# Patient Record
Sex: Male | Born: 1935 | Race: White | Hispanic: No | Marital: Married | Smoking: Never smoker
Health system: Southern US, Community
[De-identification: ages and names within clinical notes are randomized; demographics above are authoritative.]

## PROBLEM LIST (undated history)

## (undated) DIAGNOSIS — I10 Essential (primary) hypertension: Secondary | ICD-10-CM

## (undated) DIAGNOSIS — E785 Hyperlipidemia, unspecified: Secondary | ICD-10-CM

## (undated) DIAGNOSIS — B019 Varicella without complication: Secondary | ICD-10-CM

## (undated) DIAGNOSIS — I714 Abdominal aortic aneurysm, without rupture, unspecified: Secondary | ICD-10-CM

## (undated) DIAGNOSIS — B029 Zoster without complications: Secondary | ICD-10-CM

## (undated) DIAGNOSIS — I614 Nontraumatic intracerebral hemorrhage in cerebellum: Secondary | ICD-10-CM

## (undated) DIAGNOSIS — K219 Gastro-esophageal reflux disease without esophagitis: Secondary | ICD-10-CM

## (undated) DIAGNOSIS — I639 Cerebral infarction, unspecified: Secondary | ICD-10-CM

## (undated) DIAGNOSIS — N400 Enlarged prostate without lower urinary tract symptoms: Secondary | ICD-10-CM

## (undated) DIAGNOSIS — R319 Hematuria, unspecified: Secondary | ICD-10-CM

## (undated) DIAGNOSIS — I2510 Atherosclerotic heart disease of native coronary artery without angina pectoris: Secondary | ICD-10-CM

## (undated) HISTORY — DX: Hyperlipidemia, unspecified: E78.5

## (undated) HISTORY — DX: Nontraumatic intracerebral hemorrhage in cerebellum: I61.4

## (undated) HISTORY — DX: Abdominal aortic aneurysm, without rupture: I71.4

## (undated) HISTORY — DX: Cerebral infarction, unspecified: I63.9

## (undated) HISTORY — DX: Atherosclerotic heart disease of native coronary artery without angina pectoris: I25.10

## (undated) HISTORY — DX: Gastro-esophageal reflux disease without esophagitis: K21.9

## (undated) HISTORY — DX: Varicella without complication: B01.9

## (undated) HISTORY — PX: HERNIA REPAIR: SHX51

## (undated) HISTORY — PX: SKIN GRAFT: SHX250

## (undated) HISTORY — DX: Abdominal aortic aneurysm, without rupture, unspecified: I71.40

## (undated) HISTORY — DX: Hematuria, unspecified: R31.9

## (undated) HISTORY — DX: Zoster without complications: B02.9

## (undated) HISTORY — DX: Benign prostatic hyperplasia without lower urinary tract symptoms: N40.0

## (undated) HISTORY — DX: Essential (primary) hypertension: I10

## (undated) HISTORY — PX: KNEE SURGERY: SHX244

## (undated) HISTORY — PX: CORONARY ARTERY BYPASS GRAFT: SHX141

---

## 2005-07-03 ENCOUNTER — Inpatient Hospital Stay (HOSPITAL_COMMUNITY): Admission: RE | Admit: 2005-07-03 | Discharge: 2005-07-07 | Payer: Self-pay | Admitting: Orthopedic Surgery

## 2010-07-29 DIAGNOSIS — I614 Nontraumatic intracerebral hemorrhage in cerebellum: Secondary | ICD-10-CM

## 2010-07-29 HISTORY — DX: Nontraumatic intracerebral hemorrhage in cerebellum: I61.4

## 2010-08-24 ENCOUNTER — Emergency Department: Payer: Self-pay | Admitting: Emergency Medicine

## 2011-09-22 IMAGING — CT CT HEAD WITHOUT CONTRAST
3 series · 17 of 30 positions shown, 19 images · non-contrast
Comparison: none

REASON FOR EXAM: headache  fall  syncope
COMMENTS:

PROCEDURE:     CT  - CT HEAD WITHOUT CONTRAST  - August 24, 2010  [DATE]
RESULT:     Head CT dated 08/24/2009.
TECHNIQUE: Helical noncontrasted 5 mm sections were obtained from the skull
base to the vertex.

[Series 2: without · axial · non-contrast · 0.45mm/px · z∈[+311,+446]mm · 8 of 35 slices shown]
[im 4/35  brain]
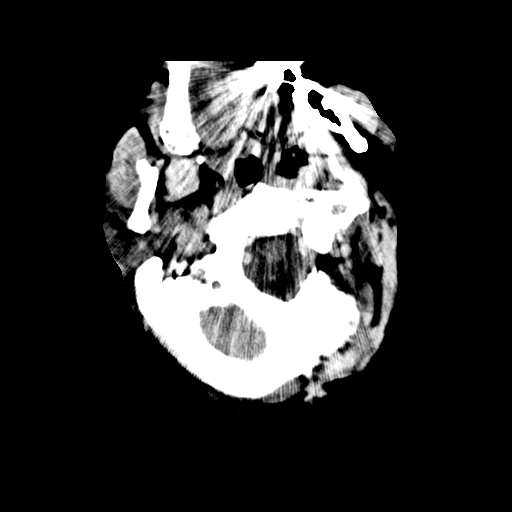
[im 7/35  brain]
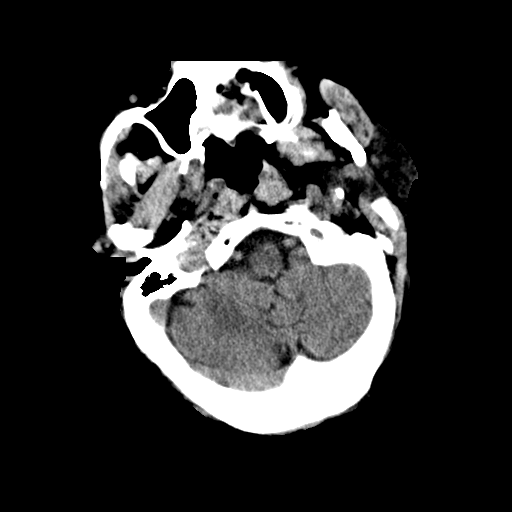
[im 11/35  brain]
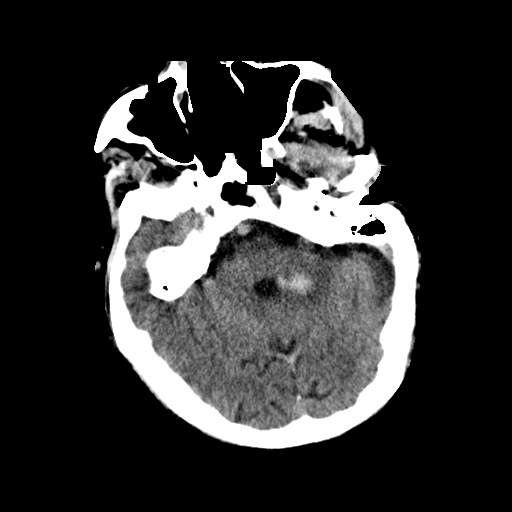
[im 14/35  brain]
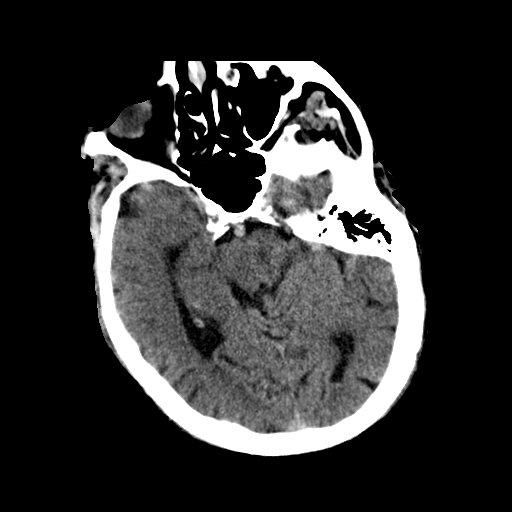
[im 21/35  brain]
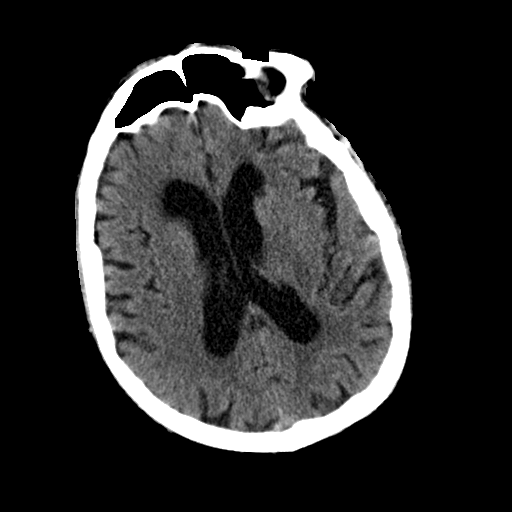
[im 24/35  brain]
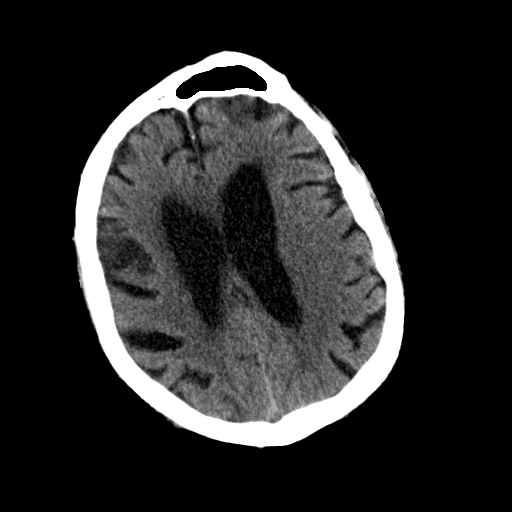
[im 28/35  brain]
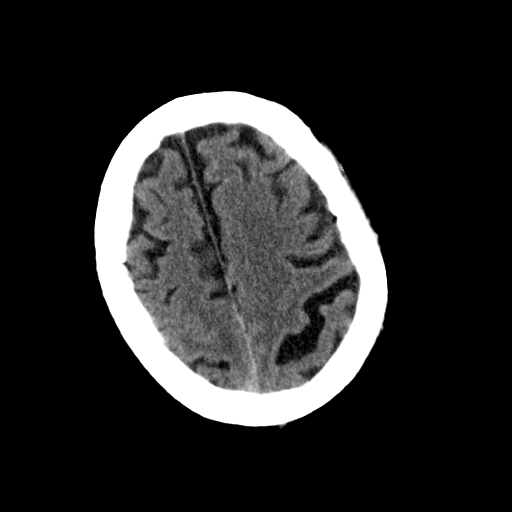
[im 31/35  brain]
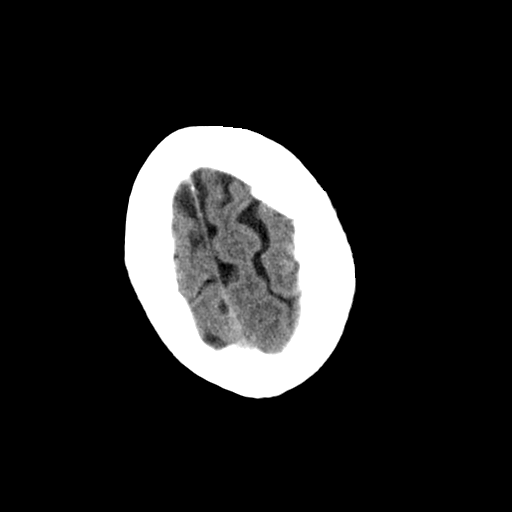

[Series 3: bone · axial · 0.45mm/px · z∈[+311,+331]mm · 2 of 35 slices shown]
[im 4/35  bone]
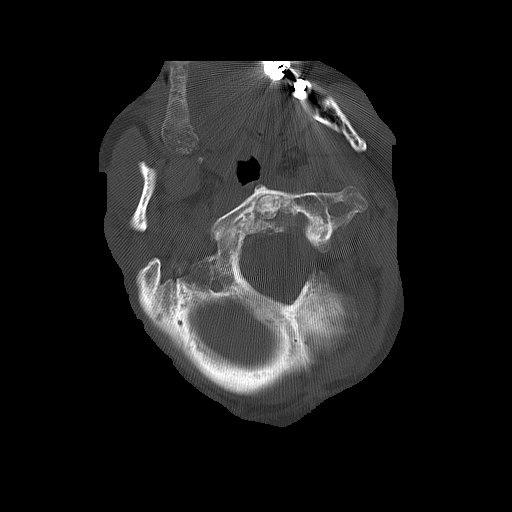
[im 8/35  bone]
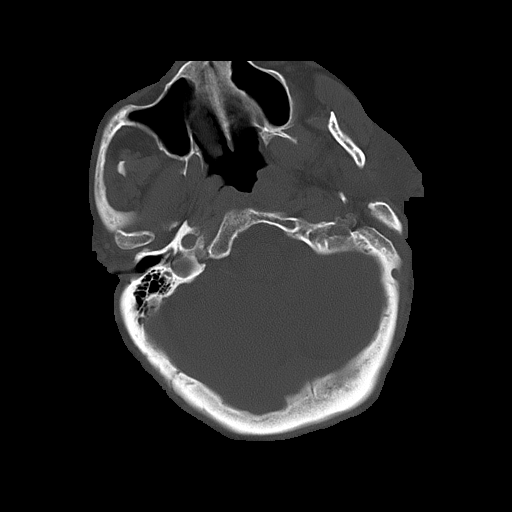

[Series 4: without recons · axial · non-contrast · 0.45mm/px · z∈[+359,+464]mm · 7 of 31 slices shown, 9 images]
[im 4/31  brain]
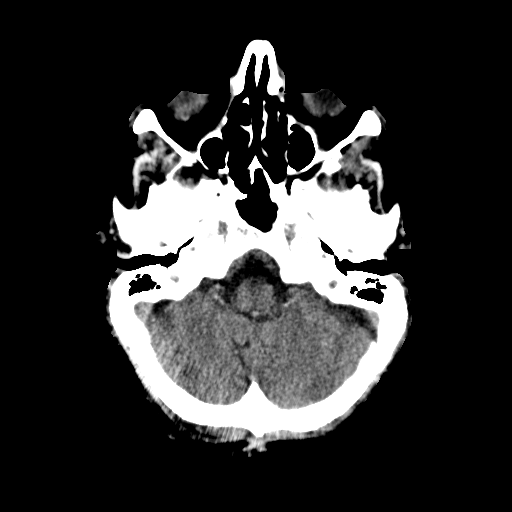
[im 4/31  bone]
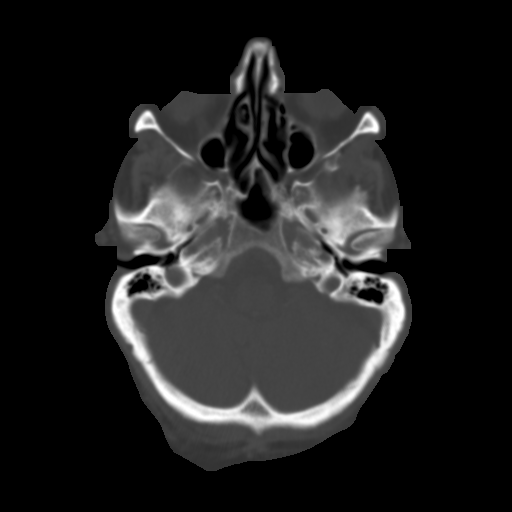
[im 8/31  brain]
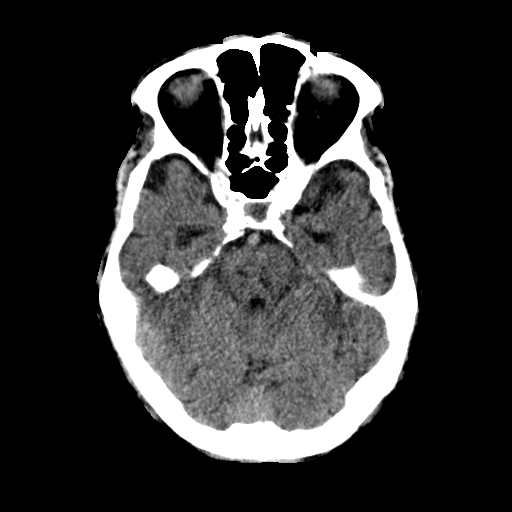
[im 12/31  brain]
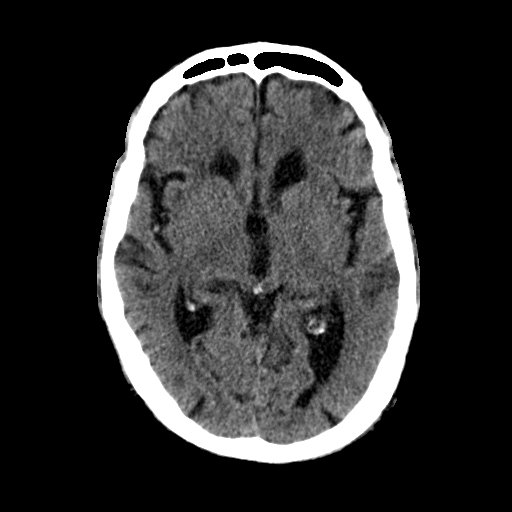
[im 16/31  brain]
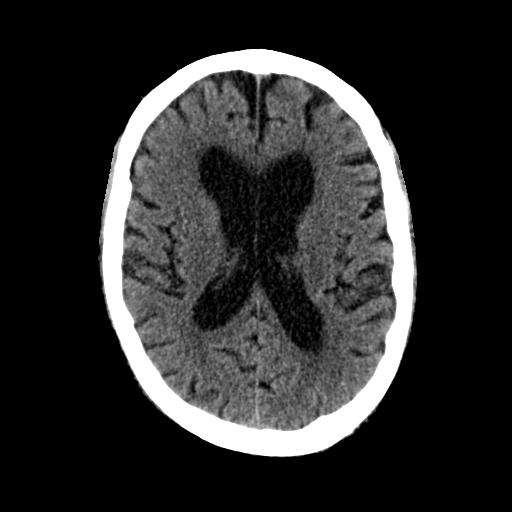
[im 19/31  brain]
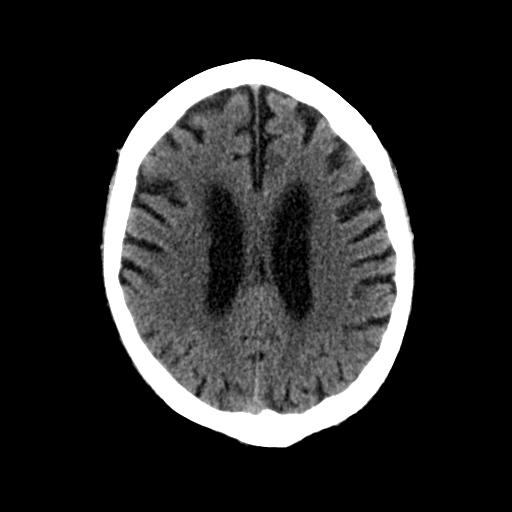
[im 19/31  bone]
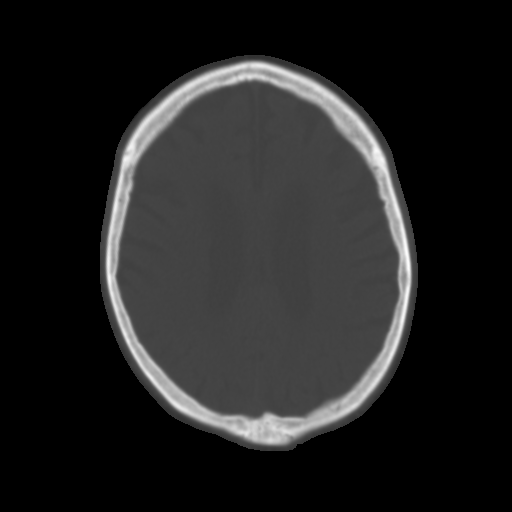
[im 23/31  brain]
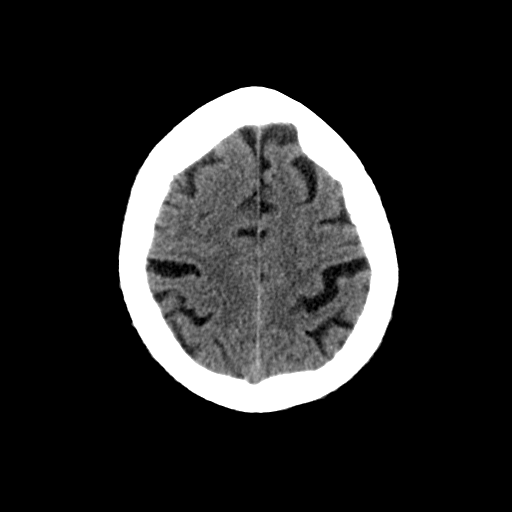
[im 27/31  brain]
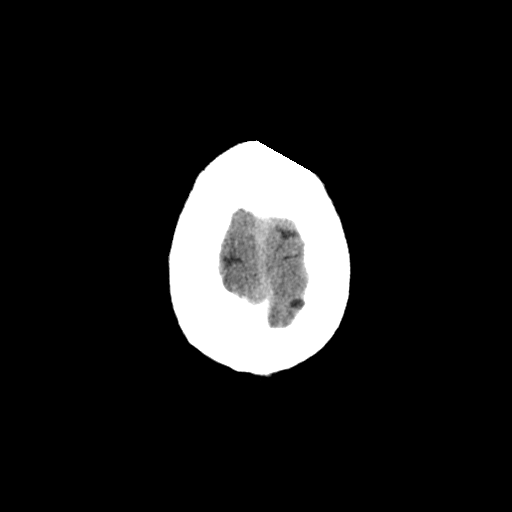

[17 of 30 positions shown; findings below may reference images not displayed]

FINDINGS: A 1.9 x 1.1 cm focus of high attenuation projects along the
cerebellar vermis region centrally on the left in a periventricular
location. This finding is consistent with an area of intraparenchymal
hemorrhage. There is no evidence of associated mass effect. There is diffuse
cortical atrophy an areas of low attenuation within the subcortical, deep,
and periventricular white matter regions. Hydrocephalus ex vacuo is
identified. The ventricles and cisterns are patent. There is no evidence of
a depressed skull fracture.
IMPRESSION: Intraparenchymal hemorrhage involving the left cerebellar
hemisphere.
2. Dr. Rendon of the emergency department was informed of these findings at
the time of initial interpretation.(*)

## 2011-09-22 IMAGING — CT CT CERVICAL SPINE WITHOUT CONTRAST
1 series · 12 of 14 positions shown, 15 images · non-contrast
Comparison: none

REASON FOR EXAM: fall
COMMENTS:

PROCEDURE:     CT  - CT CERVICAL SPINE WO  - August 24, 2010  [DATE]
RESULT:     CT cervical spine dated 08/24/2010.
TECHNIQUE: Multiplanar imaging of the cervical spine was obtained utilizing
helical 2 mm noncontrasted acquisition and bone reconstruction algorithm.

[Series 6: axial · axial · 0.34mm/px · z∈[+198,+352]mm · 12 of 91 slices shown, 15 images]
[im 7/91  soft-tissue]
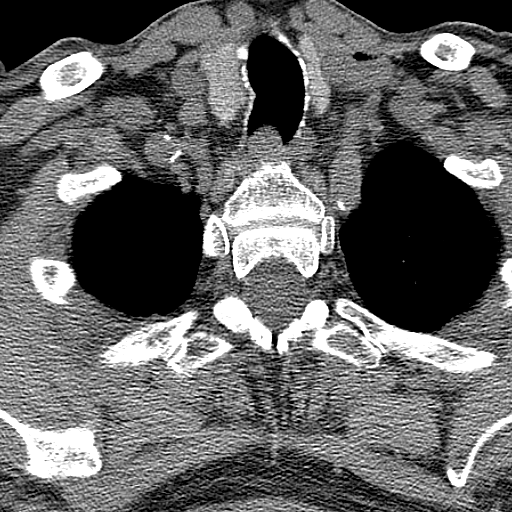
[im 7/91  bone]
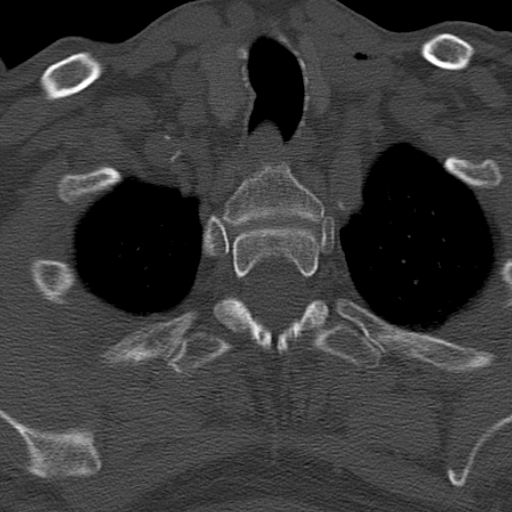
[im 14/91  bone]
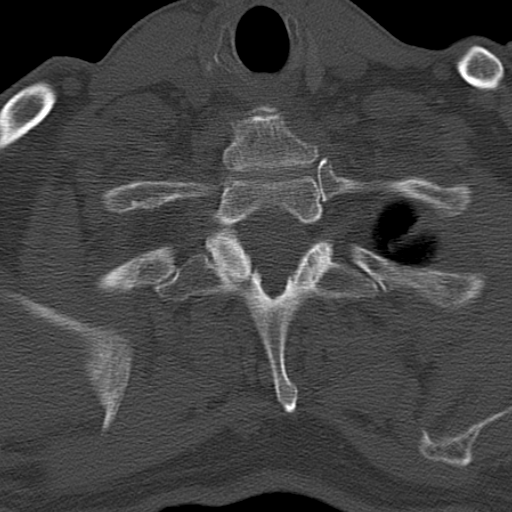
[im 21/91  bone]
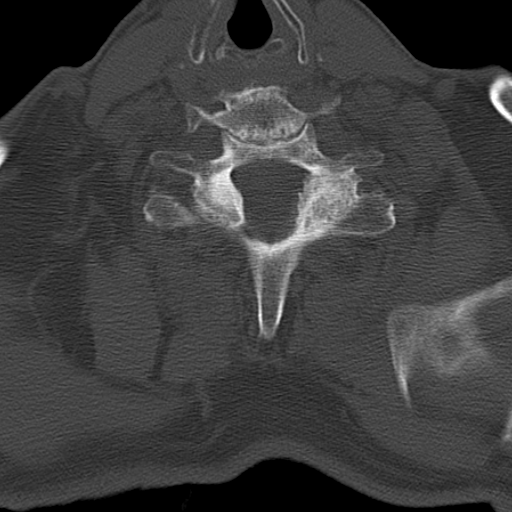
[im 28/91  bone]
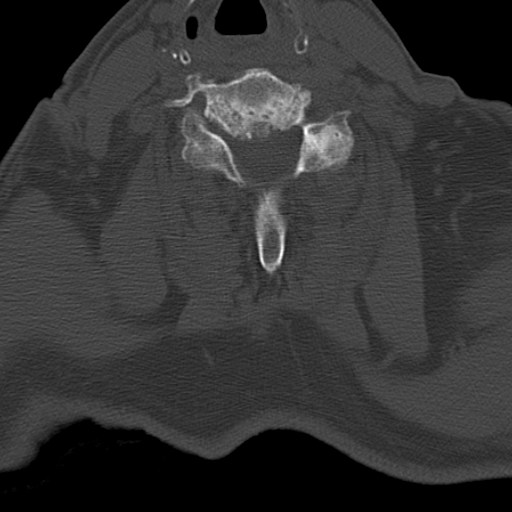
[im 35/91  soft-tissue]
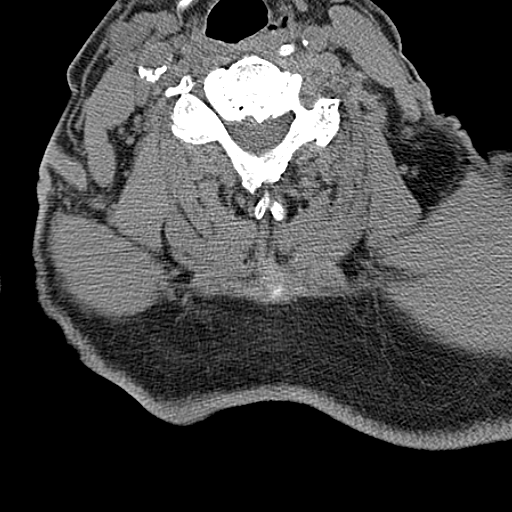
[im 35/91  bone]
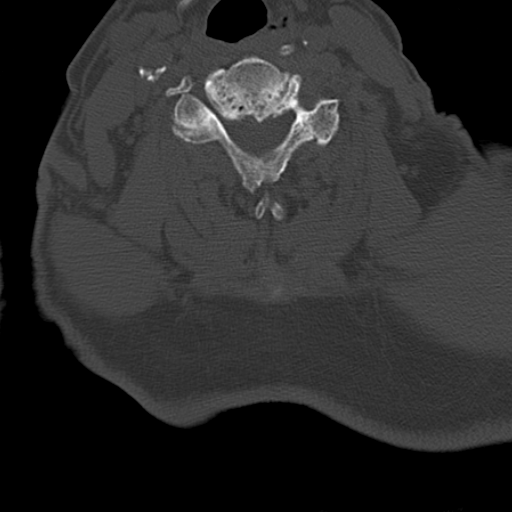
[im 42/91  bone]
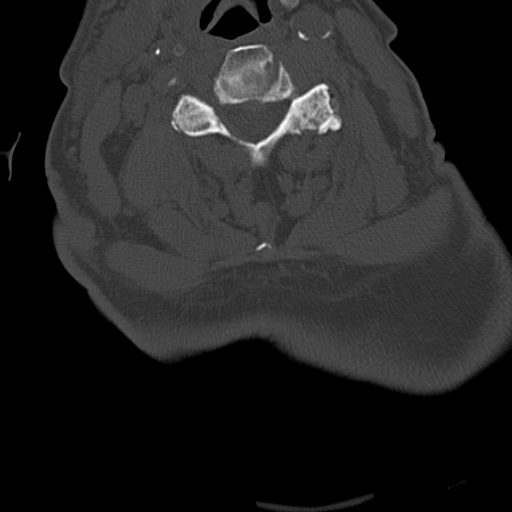
[im 49/91  bone]
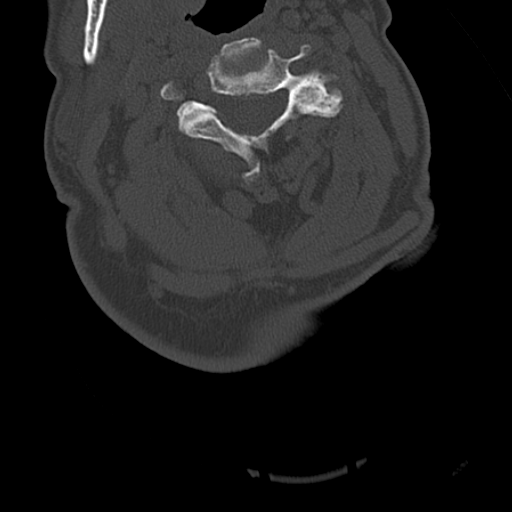
[im 56/91  bone]
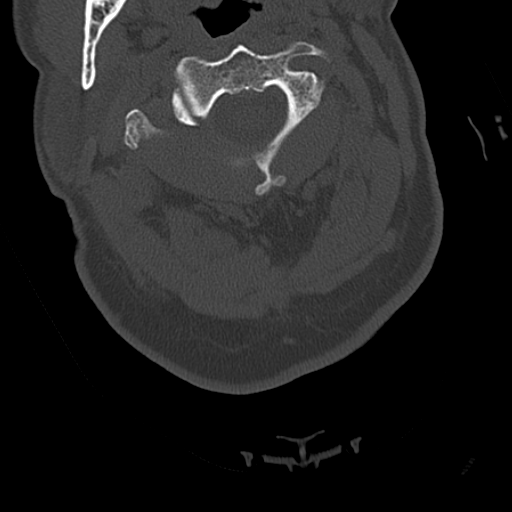
[im 63/91  soft-tissue]
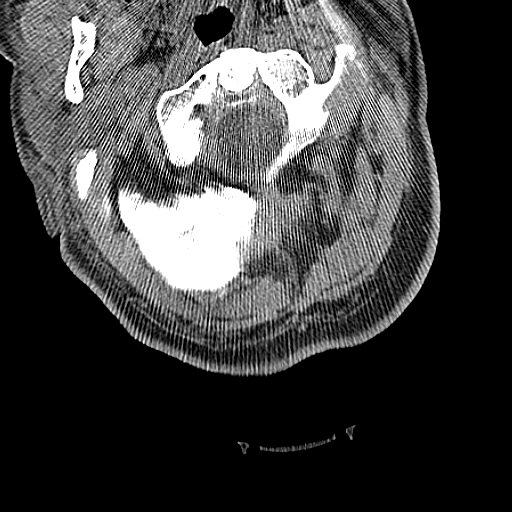
[im 63/91  bone]
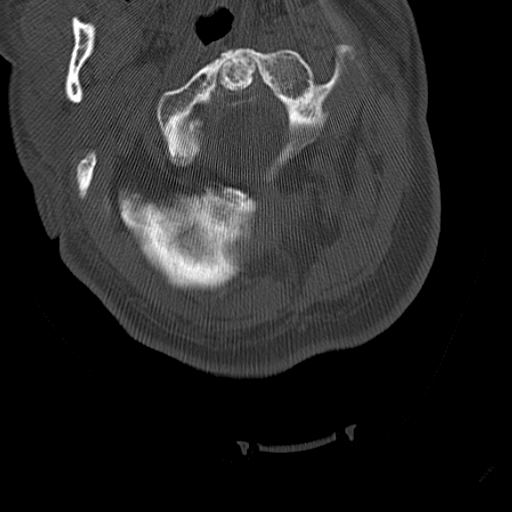
[im 70/91  bone]
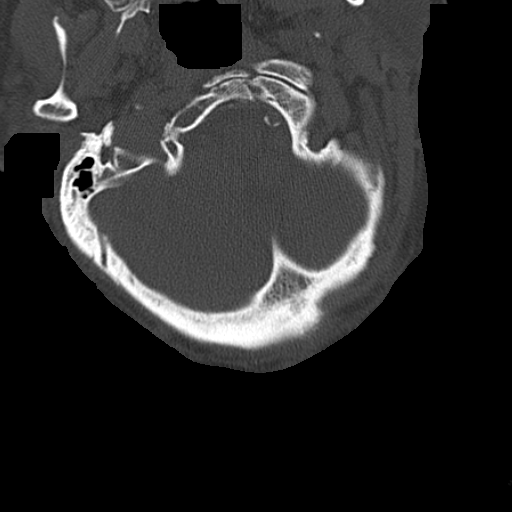
[im 77/91  bone]
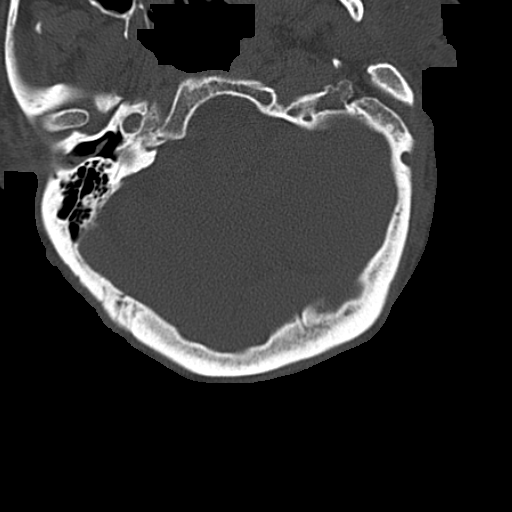
[im 84/91  bone]
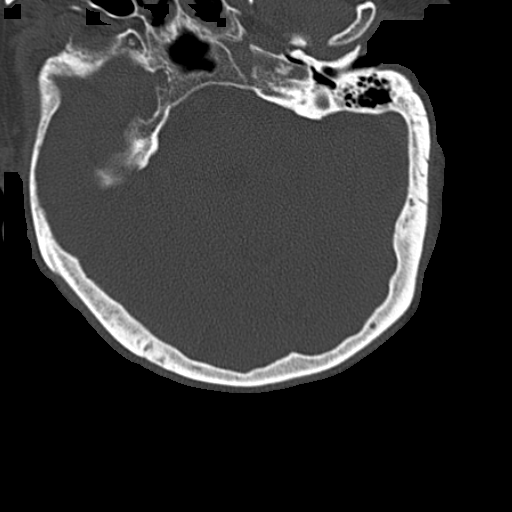

[12 of 14 positions shown; findings below may reference images not displayed]

FINDINGS: Evaluation cervical spine demonstrates no evidence of fracture,
dislocation, nor canal stenosis. Multilevel degenerative disc disease
changes appreciated within the mid and lower cervical spine. There is
increased anterior tilt of the cervical spine likely secondary to increased
kyphotic curvature of the thoracic spine.
IMPRESSION: No CT evidence of focal or acute osseous abnormalities.

## 2012-02-27 ENCOUNTER — Ambulatory Visit: Payer: Self-pay | Admitting: Unknown Physician Specialty

## 2012-03-01 ENCOUNTER — Inpatient Hospital Stay: Payer: Self-pay | Admitting: Specialist

## 2012-03-01 LAB — CBC
HCT: 33.1 % — ABNORMAL LOW (ref 40.0–52.0)
HGB: 11.3 g/dL — ABNORMAL LOW (ref 13.0–18.0)
MCH: 29.6 pg (ref 26.0–34.0)
MCV: 87 fL (ref 80–100)
Platelet: 168 10*3/uL (ref 150–440)
RBC: 3.8 10*6/uL — ABNORMAL LOW (ref 4.40–5.90)
RDW: 13.6 % (ref 11.5–14.5)
WBC: 7.4 10*3/uL (ref 3.8–10.6)

## 2012-03-01 LAB — URINALYSIS, COMPLETE
Bacteria: NONE SEEN
Glucose,UR: NEGATIVE mg/dL (ref 0–75)
Hyaline Cast: 1
Ketone: NEGATIVE
Nitrite: NEGATIVE
Ph: 5 (ref 4.5–8.0)
Protein: NEGATIVE
RBC,UR: 2 /HPF (ref 0–5)
Specific Gravity: 1.01 (ref 1.003–1.030)
Squamous Epithelial: <1
WBC UR: 5 /HPF (ref 0–5)

## 2012-03-01 LAB — COMPREHENSIVE METABOLIC PANEL
Albumin: 3.5 g/dL (ref 3.4–5.0)
Alkaline Phosphatase: 83 U/L (ref 50–136)
BUN: 14 mg/dL (ref 7–18)
Bilirubin,Total: 0.7 mg/dL (ref 0.2–1.0)
Calcium, Total: 9.3 mg/dL (ref 8.5–10.1)
Creatinine: 1.33 mg/dL — ABNORMAL HIGH (ref 0.60–1.30)
Glucose: 96 mg/dL (ref 65–99)
Potassium: 3.8 mmol/L (ref 3.5–5.1)
SGOT(AST): 35 U/L (ref 15–37)
SGPT (ALT): 23 U/L (ref 12–78)
Total Protein: 6.9 g/dL (ref 6.4–8.2)

## 2012-03-01 LAB — PROTIME-INR
INR: 1
Prothrombin Time: 13.6 secs (ref 11.5–14.7)

## 2012-03-01 LAB — HEMOGLOBIN: HGB: 10.1 g/dL — ABNORMAL LOW (ref 13.0–18.0)

## 2012-03-02 LAB — CBC WITH DIFFERENTIAL/PLATELET
Basophil #: 0 10*3/uL (ref 0.0–0.1)
Basophil %: 0.6 %
Eosinophil %: 2.1 %
HGB: 9.9 g/dL — ABNORMAL LOW (ref 13.0–18.0)
Lymphocyte #: 1.6 10*3/uL (ref 1.0–3.6)
Lymphocyte %: 25.1 %
MCH: 30.2 pg (ref 26.0–34.0)
MCHC: 34.6 g/dL (ref 32.0–36.0)
MCV: 87 fL (ref 80–100)
Monocyte #: 0.8 x10 3/mm (ref 0.2–1.0)
Monocyte %: 12 %
Neutrophil #: 3.8 10*3/uL (ref 1.4–6.5)
Neutrophil %: 60.2 %
Platelet: 140 10*3/uL — ABNORMAL LOW (ref 150–440)
RBC: 3.29 10*6/uL — ABNORMAL LOW (ref 4.40–5.90)
RDW: 13.5 % (ref 11.5–14.5)
WBC: 6.4 10*3/uL (ref 3.8–10.6)

## 2012-03-02 LAB — BASIC METABOLIC PANEL
Anion Gap: 13 (ref 7–16)
BUN: 12 mg/dL (ref 7–18)
Calcium, Total: 9.2 mg/dL (ref 8.5–10.1)
Chloride: 106 mmol/L (ref 98–107)
Co2: 23 mmol/L (ref 21–32)
Creatinine: 1.14 mg/dL (ref 0.60–1.30)
EGFR (African American): >60
Glucose: 94 mg/dL (ref 65–99)
Osmolality: 283 (ref 275–301)
Potassium: 3.7 mmol/L (ref 3.5–5.1)
Sodium: 142 mmol/L (ref 136–145)

## 2012-03-03 LAB — PATHOLOGY REPORT

## 2012-07-29 DIAGNOSIS — B019 Varicella without complication: Secondary | ICD-10-CM

## 2012-07-29 DIAGNOSIS — B029 Zoster without complications: Secondary | ICD-10-CM

## 2012-07-29 HISTORY — DX: Varicella without complication: B01.9

## 2012-07-29 HISTORY — DX: Zoster without complications: B02.9

## 2013-08-09 ENCOUNTER — Encounter: Payer: Self-pay | Admitting: Internal Medicine

## 2013-08-09 DIAGNOSIS — I714 Abdominal aortic aneurysm, without rupture, unspecified: Secondary | ICD-10-CM | POA: Insufficient documentation

## 2013-08-09 DIAGNOSIS — N400 Enlarged prostate without lower urinary tract symptoms: Secondary | ICD-10-CM | POA: Insufficient documentation

## 2013-08-09 DIAGNOSIS — I6990 Unspecified sequelae of unspecified cerebrovascular disease: Secondary | ICD-10-CM | POA: Insufficient documentation

## 2013-08-09 DIAGNOSIS — I10 Essential (primary) hypertension: Secondary | ICD-10-CM | POA: Insufficient documentation

## 2013-08-09 DIAGNOSIS — K219 Gastro-esophageal reflux disease without esophagitis: Secondary | ICD-10-CM | POA: Insufficient documentation

## 2013-08-09 DIAGNOSIS — E785 Hyperlipidemia, unspecified: Secondary | ICD-10-CM | POA: Insufficient documentation

## 2013-08-09 DIAGNOSIS — I2510 Atherosclerotic heart disease of native coronary artery without angina pectoris: Secondary | ICD-10-CM | POA: Insufficient documentation

## 2013-08-09 DIAGNOSIS — I614 Nontraumatic intracerebral hemorrhage in cerebellum: Secondary | ICD-10-CM | POA: Insufficient documentation

## 2013-08-11 ENCOUNTER — Ambulatory Visit (INDEPENDENT_AMBULATORY_CARE_PROVIDER_SITE_OTHER): Payer: Medicare Other | Admitting: Internal Medicine

## 2013-08-11 ENCOUNTER — Encounter: Payer: Self-pay | Admitting: Internal Medicine

## 2013-08-11 VITALS — BP 136/72 | HR 66 | Resp 12 | Wt 178.0 lb

## 2013-08-11 DIAGNOSIS — B0229 Other postherpetic nervous system involvement: Secondary | ICD-10-CM | POA: Insufficient documentation

## 2013-08-11 DIAGNOSIS — N400 Enlarged prostate without lower urinary tract symptoms: Secondary | ICD-10-CM

## 2013-08-11 DIAGNOSIS — I614 Nontraumatic intracerebral hemorrhage in cerebellum: Secondary | ICD-10-CM

## 2013-08-11 DIAGNOSIS — B029 Zoster without complications: Secondary | ICD-10-CM

## 2013-08-11 DIAGNOSIS — I2510 Atherosclerotic heart disease of native coronary artery without angina pectoris: Secondary | ICD-10-CM

## 2013-08-11 DIAGNOSIS — I619 Nontraumatic intracerebral hemorrhage, unspecified: Secondary | ICD-10-CM

## 2013-08-11 DIAGNOSIS — E785 Hyperlipidemia, unspecified: Secondary | ICD-10-CM

## 2013-08-11 DIAGNOSIS — B019 Varicella without complication: Secondary | ICD-10-CM

## 2013-08-11 DIAGNOSIS — I6990 Unspecified sequelae of unspecified cerebrovascular disease: Secondary | ICD-10-CM

## 2013-08-11 DIAGNOSIS — K219 Gastro-esophageal reflux disease without esophagitis: Secondary | ICD-10-CM

## 2013-08-11 DIAGNOSIS — I10 Essential (primary) hypertension: Secondary | ICD-10-CM

## 2013-08-11 NOTE — Assessment & Plan Note (Signed)
No symptoms since bypass

## 2013-08-11 NOTE — Assessment & Plan Note (Signed)
Still has trouble walking Frequent falls Needs hearing aide (lost hearing with the stroke-some improvement) Mostly home bound  Needs help with showering, dressing and bathroom Now on hospice--has aides twice a week May not need to be recertified

## 2013-08-11 NOTE — Assessment & Plan Note (Signed)
On statin as secondary prevention

## 2013-08-11 NOTE — Assessment & Plan Note (Signed)
BP Readings from Last 3 Encounters:  08/11/13 136/72   Good control on med Was on HCTZ--this has been discontinued

## 2013-08-11 NOTE — Assessment & Plan Note (Signed)
Lesions better Still some pain Started on hospice due to much worse functional status when this started---has improved some

## 2013-08-11 NOTE — Assessment & Plan Note (Signed)
Quiet on the PPI Should continue for symptom relief and also on NSAID now for zoster pain

## 2013-08-11 NOTE — Assessment & Plan Note (Signed)
Okay on tamsulosin °

## 2013-08-11 NOTE — Assessment & Plan Note (Signed)
On ASA as secondary prevention
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
## 2013-09-01 ENCOUNTER — Telehealth: Payer: Self-pay | Admitting: Internal Medicine

## 2013-09-01 NOTE — Telephone Encounter (Signed)
Relevant patient education mailed to patient.  

## 2013-10-27 ENCOUNTER — Encounter: Payer: Self-pay | Admitting: Internal Medicine

## 2013-10-27 ENCOUNTER — Ambulatory Visit: Admitting: Internal Medicine

## 2013-10-27 VITALS — BP 94/62 | HR 72 | Resp 14 | Wt 165.0 lb

## 2013-10-27 DIAGNOSIS — N400 Enlarged prostate without lower urinary tract symptoms: Secondary | ICD-10-CM

## 2013-10-27 DIAGNOSIS — I10 Essential (primary) hypertension: Secondary | ICD-10-CM

## 2013-10-27 DIAGNOSIS — I2510 Atherosclerotic heart disease of native coronary artery without angina pectoris: Secondary | ICD-10-CM

## 2013-10-27 DIAGNOSIS — R634 Abnormal weight loss: Secondary | ICD-10-CM | POA: Insufficient documentation

## 2013-10-27 DIAGNOSIS — K219 Gastro-esophageal reflux disease without esophagitis: Secondary | ICD-10-CM

## 2013-10-27 DIAGNOSIS — I614 Nontraumatic intracerebral hemorrhage in cerebellum: Secondary | ICD-10-CM

## 2013-10-27 DIAGNOSIS — I619 Nontraumatic intracerebral hemorrhage, unspecified: Secondary | ICD-10-CM

## 2013-10-27 DIAGNOSIS — I6990 Unspecified sequelae of unspecified cerebrovascular disease: Secondary | ICD-10-CM

## 2013-10-27 DIAGNOSIS — B0229 Other postherpetic nervous system involvement: Secondary | ICD-10-CM

## 2013-10-27 NOTE — Assessment & Plan Note (Signed)
Quiet on the med 

## 2013-10-27 NOTE — Assessment & Plan Note (Signed)
No symptoms ASA,statin and ACEI

## 2013-10-27 NOTE — Assessment & Plan Note (Signed)
2 years ago or so Cause of disability

## 2013-10-27 NOTE — Assessment & Plan Note (Signed)
Unstable gait Needs help with ADLs On ASA, statin, BP meds
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
## 2013-10-27 NOTE — Assessment & Plan Note (Signed)
BP Readings from Last 3 Encounters:  10/27/13 94/62  08/11/13 136/72   BP down Will stop the HCTZ (I didn't know he was taking this)

## 2013-10-27 NOTE — Assessment & Plan Note (Signed)
Still symptomatic but will just continue the tamsulosin Largely incontinent

## 2013-10-27 NOTE — Assessment & Plan Note (Signed)
Mild pain Will hold off on meds

## 2013-10-27 NOTE — Assessment & Plan Note (Signed)
Eating well but weight is down Will add boost with ice cream Continues on hospice

## 2013-11-08 ENCOUNTER — Telehealth: Payer: Self-pay | Admitting: Internal Medicine

## 2013-11-08 NOTE — Telephone Encounter (Signed)
[place] Has recurrent stage 1 sacral decubitus Doesn't like to change his sitting position Will occ get worse but no clear stage 2 Family asked about santyl---told her this would be inappropriate  Will use a barrier cream like endit (or other zinc oxide containing cream) If overt stage 2, can use hydrogel

## 2014-01-17 ENCOUNTER — Other Ambulatory Visit: Payer: Self-pay | Admitting: Internal Medicine

## 2014-01-17 NOTE — Telephone Encounter (Signed)
Okay to refill for a year 

## 2014-01-19 ENCOUNTER — Encounter: Payer: Self-pay | Admitting: Internal Medicine

## 2014-01-19 ENCOUNTER — Ambulatory Visit: Payer: Medicare Other | Admitting: Internal Medicine

## 2014-01-19 VITALS — BP 122/60 | HR 60 | Resp 12 | Wt 165.0 lb

## 2014-01-19 DIAGNOSIS — I614 Nontraumatic intracerebral hemorrhage in cerebellum: Secondary | ICD-10-CM

## 2014-01-19 DIAGNOSIS — I6990 Unspecified sequelae of unspecified cerebrovascular disease: Secondary | ICD-10-CM

## 2014-01-19 DIAGNOSIS — K219 Gastro-esophageal reflux disease without esophagitis: Secondary | ICD-10-CM

## 2014-01-19 DIAGNOSIS — B0229 Other postherpetic nervous system involvement: Secondary | ICD-10-CM

## 2014-01-19 DIAGNOSIS — I10 Essential (primary) hypertension: Secondary | ICD-10-CM | POA: Diagnosis not present

## 2014-01-19 DIAGNOSIS — I2510 Atherosclerotic heart disease of native coronary artery without angina pectoris: Secondary | ICD-10-CM

## 2014-01-19 DIAGNOSIS — E785 Hyperlipidemia, unspecified: Secondary | ICD-10-CM

## 2014-01-19 DIAGNOSIS — I619 Nontraumatic intracerebral hemorrhage, unspecified: Secondary | ICD-10-CM

## 2014-01-19 DIAGNOSIS — I2584 Coronary atherosclerosis due to calcified coronary lesion: Secondary | ICD-10-CM

## 2014-01-19 NOTE — Assessment & Plan Note (Signed)
Needs significant assistance still Seems to be eating better and weight now stabilized Continues to be on hospice which has provided he and wife some support

## 2014-01-19 NOTE — Assessment & Plan Note (Signed)
Will continue secondary prevention with statin

## 2014-01-19 NOTE — Assessment & Plan Note (Signed)
Controlled with the omeprazole 

## 2014-01-19 NOTE — Assessment & Plan Note (Signed)
Has been quiet On appropriate meds 

## 2014-01-19 NOTE — Assessment & Plan Note (Signed)
Cause of disability Gait and balance problems continue but are stable
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
## 2014-01-19 NOTE — Assessment & Plan Note (Signed)
Improved and seems to be resolved

## 2014-01-19 NOTE — Assessment & Plan Note (Signed)
BP Readings from Last 3 Encounters:  01/19/14 122/60  10/27/13 94/62  08/11/13 136/72   Good control No changes needed

## 2014-03-23 ENCOUNTER — Ambulatory Visit: Payer: Medicare Other | Admitting: Internal Medicine

## 2014-03-23 ENCOUNTER — Encounter: Payer: Self-pay | Admitting: Internal Medicine

## 2014-03-23 VITALS — BP 124/64 | HR 78 | Resp 16

## 2014-03-23 DIAGNOSIS — G479 Sleep disorder, unspecified: Secondary | ICD-10-CM | POA: Diagnosis not present

## 2014-03-23 DIAGNOSIS — N400 Enlarged prostate without lower urinary tract symptoms: Secondary | ICD-10-CM

## 2014-03-23 DIAGNOSIS — E785 Hyperlipidemia, unspecified: Secondary | ICD-10-CM

## 2014-03-23 DIAGNOSIS — K219 Gastro-esophageal reflux disease without esophagitis: Secondary | ICD-10-CM

## 2014-03-23 DIAGNOSIS — I6990 Unspecified sequelae of unspecified cerebrovascular disease: Secondary | ICD-10-CM | POA: Diagnosis not present

## 2014-03-23 DIAGNOSIS — Z23 Encounter for immunization: Secondary | ICD-10-CM

## 2014-03-23 DIAGNOSIS — I2510 Atherosclerotic heart disease of native coronary artery without angina pectoris: Secondary | ICD-10-CM

## 2014-03-23 DIAGNOSIS — I10 Essential (primary) hypertension: Secondary | ICD-10-CM

## 2014-03-23 NOTE — Assessment & Plan Note (Signed)
Worsening status and more falls Unsafe with walker but wife can't push him in wheelchair and he can't self propel No motorized wheelchairs available via hospice---can try him in scooter and see if his truncal stability is enough to sit stably in one On ASA and statin

## 2014-03-23 NOTE — Assessment & Plan Note (Signed)
No symptoms but some dysphagia (that seems more related to stroke) Discussed safe swallowing techniques

## 2014-03-23 NOTE — Assessment & Plan Note (Signed)
Probably has too much meds at night Will stop the lorazepam at night--only use prn Try the trazodone alone

## 2014-03-23 NOTE — Assessment & Plan Note (Signed)
Still slow but now voids in diaper No signs of serious retention at this point Continue the tamsulosin

## 2014-03-23 NOTE — Assessment & Plan Note (Signed)
BP Readings from Last 3 Encounters:  03/23/14 124/64  01/19/14 122/60  10/27/13 94/62   Good control Will continue ACEI

## 2014-03-23 NOTE — Assessment & Plan Note (Signed)
Has been quiet On appropriate meds 

## 2014-03-23 NOTE — Addendum Note (Signed)
Addended by: Sueanne Margarita on: 03/23/2014 04:00 PM   Modules accepted: Orders, Medications
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
## 2014-03-23 NOTE — Assessment & Plan Note (Signed)
Continues on secondary prevention with statin 

## 2014-05-03 ENCOUNTER — Telehealth: Payer: Self-pay

## 2014-05-03 NOTE — Telephone Encounter (Signed)
Francisco [name] notified as instructed by telephone. Was advised that she works with patient's granddaughter (a Engineer, civil (consulting)nurse)  and she is not sure that he meets the criteria. Francisco SagoSarah stated that she is going to see the  patient tomorrow and evaluate the situation.

## 2014-05-03 NOTE — Telephone Encounter (Signed)
Let her know that he hasn't been doing great---but not sure that he fits the 6 month hospice criteria.  If he goes off hospice, make sure all DME is accounted for so there is no gap in what he needs

## 2014-05-03 NOTE — Telephone Encounter (Signed)
Francisco [name] with [facility] left v/m re; to pts hospice eligibility; Francisco [name] is border line for hospice services and Francisco [name] request cb. Spoke with Maralyn SagoSarah; Francisco [name] is borderline stable, no change in last 2 - 3 months. Nurse Practitioner has seen Francisco [name] again and said no change since seen 3 months ago. Francisco [name] is up for recertification and Maralyn SagoSarah wants Dr Karle StarchLetvak's thoughts if he can find cause for Francisco [name] to remain on hospice. Francisco [name] has no change in diet or condition; if pts scooter is not available he can still shuffle feet with walker.Please advise.

## 2014-05-04 NOTE — Telephone Encounter (Signed)
Yes  His GD Tammy and I spoke about this at my last visit there

## 2014-05-18 ENCOUNTER — Telehealth: Payer: Self-pay | Admitting: Internal Medicine

## 2014-05-18 NOTE — Telephone Encounter (Signed)
Pt was on hospice, and now hospice is releasing patient and they are taking the power wheel chair and they (the family) need a order for power wheelchair. Need face to face visit, daughter thinks [employer] is the company they are using.

## 2014-05-19 NOTE — Telephone Encounter (Signed)
Order written--can fax I will be seeing him within 2-3 weeks and can do the face to face then

## 2014-05-19 NOTE — Telephone Encounter (Signed)
Spoke with daughter and advised results. Order mailed to home address

## 2014-05-20 ENCOUNTER — Telehealth: Payer: Self-pay | Admitting: *Deleted

## 2014-05-20 NOTE — Telephone Encounter (Signed)
Amil AmenJulia calling from [facility], stating that they received home health order for skilled nursing and social work. She is confused because the patient is doing well and being discharged from hospice 05/19/14, she asked for an order for PT and OT for strength training. She will hold order until she hear back from Dr. Alphonsus SiasLetvak  (order on your desk)

## 2014-05-21 NOTE — Telephone Encounter (Signed)
I am not overly happy with the lack of communication with his discharge from hospice. I knew this was pending but noone gave me advance notice for his DME needs (the wheelchair), etc Then I was just faxed a referral for [facility] without any information or what their plans were I would think that ongoing nursing supervision and help with the DME was the most important issues (not therapy)  Once again, I feel hospice has done a poor job with coordinating a discharge. Please tell them to let Dois DavenportSandra and Theron Aristaeter know about my displeasure!!

## 2014-05-23 NOTE — Telephone Encounter (Signed)
I reviewed my concerns with Francisco [name] today by phone

## 2014-05-23 NOTE — Telephone Encounter (Signed)
Left message to have Amil AmenJulia return my call.

## 2014-05-23 NOTE — Telephone Encounter (Signed)
Spoke with Francisco [name] and advised results she will speak with Dois DavenportSandra or Theron Aristaeter to see if home health is needed.

## 2014-06-08 ENCOUNTER — Ambulatory Visit: Payer: Medicare Other | Admitting: Internal Medicine

## 2014-06-08 ENCOUNTER — Encounter: Payer: Self-pay | Admitting: Internal Medicine

## 2014-06-08 VITALS — BP 124/70 | HR 72 | Resp 24 | Wt 160.0 lb

## 2014-06-08 DIAGNOSIS — I614 Nontraumatic intracerebral hemorrhage in cerebellum: Secondary | ICD-10-CM

## 2014-06-08 DIAGNOSIS — I714 Abdominal aortic aneurysm, without rupture, unspecified: Secondary | ICD-10-CM

## 2014-06-08 DIAGNOSIS — I6990 Unspecified sequelae of unspecified cerebrovascular disease: Secondary | ICD-10-CM

## 2014-06-08 DIAGNOSIS — N400 Enlarged prostate without lower urinary tract symptoms: Secondary | ICD-10-CM

## 2014-06-08 DIAGNOSIS — I2510 Atherosclerotic heart disease of native coronary artery without angina pectoris: Secondary | ICD-10-CM

## 2014-06-08 DIAGNOSIS — G479 Sleep disorder, unspecified: Secondary | ICD-10-CM

## 2014-06-08 DIAGNOSIS — I10 Essential (primary) hypertension: Secondary | ICD-10-CM

## 2014-06-08 MED ORDER — DERMACLOUD EX CREA: 1.0000 "application " | TOPICAL_CREAM | Freq: Three times a day (TID) | CUTANEOUS | Status: AC | PRN

## 2014-06-08 MED ORDER — LISINOPRIL 20 MG PO TABS: 20.0000 mg | ORAL_TABLET | Freq: Every day | ORAL | Status: DC

## 2014-06-08 MED ORDER — NYSTATIN 100000 UNIT/GM EX POWD: 1.0000 g | Freq: Three times a day (TID) | CUTANEOUS | Status: AC | PRN

## 2014-06-08 MED ORDER — ATORVASTATIN CALCIUM 20 MG PO TABS: 20.0000 mg | ORAL_TABLET | Freq: Every day | ORAL | Status: AC

## 2014-06-08 NOTE — Assessment & Plan Note (Signed)
Given overall status, will not image again

## 2014-06-08 NOTE — Assessment & Plan Note (Signed)
No evidence of recurrence Disabling event Continues on secondary prevention with asa, BP and statin Rx

## 2014-06-08 NOTE — Assessment & Plan Note (Signed)
BP Readings from Last 3 Encounters:  06/08/14 124/70  03/23/14 124/64  01/19/14 122/60   Good control No changes needed

## 2014-06-08 NOTE — Assessment & Plan Note (Signed)
Weak in legs and right arm Unable to walk and increasing problems even standing Incontinent and close to total care Needs motorized wheelchair--they will take my Rx and this face to face visit report to a company to get it
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
## 2014-06-08 NOTE — Assessment & Plan Note (Signed)
Has some chest pain at times that doesn't seem ischemic On appropriate meds

## 2014-06-08 NOTE — Assessment & Plan Note (Signed)
Ongoing problems but does sleep some with tylenol, trazodone and lorazepam at bedtime

## 2014-06-08 NOTE — Assessment & Plan Note (Signed)
Incontinent Doesn't seem to have retention

## 2014-06-20 ENCOUNTER — Other Ambulatory Visit: Payer: Self-pay | Admitting: Internal Medicine

## 2014-07-14 ENCOUNTER — Other Ambulatory Visit: Payer: Self-pay | Admitting: Internal Medicine

## 2014-07-14 NOTE — Telephone Encounter (Signed)
Approved: #30 x 0 1 at bedtime prn

## 2014-07-14 NOTE — Telephone Encounter (Signed)
There is no history on the medication

## 2014-07-15 NOTE — Telephone Encounter (Signed)
rx called into pharmacy

## 2014-08-03 ENCOUNTER — Other Ambulatory Visit: Payer: Self-pay | Admitting: Internal Medicine

## 2014-08-04 ENCOUNTER — Other Ambulatory Visit: Payer: Self-pay | Admitting: Internal Medicine

## 2014-08-29 ENCOUNTER — Telehealth: Payer: Self-pay | Admitting: Internal Medicine

## 2014-08-29 NOTE — Telephone Encounter (Signed)
Okay Don't know why they notified me about this. I will take him off my list

## 2014-08-29 NOTE — Telephone Encounter (Signed)
Patient's daughter returned your call.  Patient's daughter said you had called her mother about making a home visit.  Dr.Hodges put patient back under Hospice.  Patient is bed ridden, so patient won't need for you to come and see him. Patient's daughter said thank you for all that you've done for him.  If you have any questions, you can call Debbie.

## 2014-10-05 ENCOUNTER — Other Ambulatory Visit: Payer: Self-pay | Admitting: Internal Medicine

## 2014-11-15 NOTE — H&P (Signed)
PATIENT NAME:  Francisco [name], Ferdinando A MR#:  [id number] DATE OF BIRTH:  1928/10/26  DATE OF ADMISSION:  03/01/2012  REFERRING PHYSICIAN: Dr. Margarita GrizzleWoodruff   PRIMARY CARE PHYSICIAN: [facility]ridge [facility]   GASTROENTEROLOGIST: Dr. Mechele CollinElliott    CHIEF COMPLAINT: Lower GI bleed.   HISTORY OF PRESENT ILLNESS: The patient is a pleasant 81 year old Caucasian male with history of coronary artery bypass graft in 1985, brainstem CVA in January 2012, hypertension, and hyperlipidemia who presents with above symptoms. The patient of note has been having some hemorrhoidal bleed as of June 17th off and on. By July 17th it had resolved after hydrocortisone suppositories. The patient was seen by Dr. Mechele CollinElliott as an outpatient several weeks ago. An EGD and colonoscopy was scheduled which was done on August 1st. Per colonoscopy report, the patient had eight medium polyps in the sigmoid colon, descending colon, and transverse colon as well as ascending colon and cecum. These were resected. The patient also was noted to have diverticulosis in the sigmoid and the descending colon. EGD showed gastritis. This was biopsied. Since then the patient has been having continuous bright red blood per rectum. The patient has been having bowel movements hourly. The patient has been wearing pull-up Depends, has been having bright red blood per rectum and diapers have been getting changed every two hours. Denies any palpitations, chest pain, or shortness of breath. Denies having any abdominal pain, nausea or vomiting. Today there is some clotting noted per family. He was brought in here and today his hemoglobin is noted to be 11.3. His last hemoglobin as of January 2012 was 13.6. Hospitalist service was contacted for further evaluation and management.   PAST MEDICAL HISTORY:  1. Hypertension.  2. Hyperlipidemia.  3. Hemorrhagic CVA of the brainstem, [facility]. 4. Status post CABG. 5. Right knee replacement. 6. Chronic  dizziness.   ALLERGIES: No known drug allergies.   MEDICATIONS:  1. Aspirin 81 mg daily which the patient has not taken for about a month. 2. Lipitor 30 mg daily.  3. Hydrochlorothiazide 25 mg one-half tab daily. 4. Lisinopril 20 mg daily.  5. Metamucil one cap once a day. 6. Metoprolol tartrate 25 mg 2 times a day.  7. Omeprazole 20 mg one cap daily.   SOCIAL HISTORY: Occasional alcohol. No tobacco. Lives with his wife. No drug use.   FAMILY HISTORY: Discussed and noncontributory to this case. No history of lower GI bleed.   REVIEW OF SYSTEMS: CONSTITUTIONAL: No fever, fatigue, weakness, or weight changes. EYES: No blurry vision or double vision. ENT: No tinnitus or hearing loss. RESPIRATORY: No cough, wheezing, hemoptysis, or COPD. No shortness of breath. CARDIOVASCULAR: No chest pain or orthopnea. Has chronic lower extremity edema. No arrhythmia. Positive for high blood pressure. GI: No nausea, vomiting, or abdominal pain. Positive for hemorrhoids. Positive for lower GI bleeding. No hematemesis. Positive for diverticulosis and polyps recently removed. GU: Denies dysuria, hematuria. Has benign prostatic hypertrophy. ENDOCRINE: No polyuria or nocturia. HEME/LYMPH: No easy bruising. Positive for bleeding as above. SKIN: No new rashes. MUSCULOSKELETAL: Denies arthritis. NEUROLOGIC: Has chronic dizziness, history of CVA. PSYCHIATRIC: No anxiety or depression.   PHYSICAL EXAMINATION:   VITAL SIGNS: Temperature 98.4, pulse on arrival 84, currently 74, respiratory rate 18, blood pressure on arrival 119/58, currently 123/62, oxygen saturation 96% on room air.   GENERAL: The patient is a pleasant Caucasian male laying in bed in no obvious distress talking in full sentences, pale.   HEENT: Normocephalic,  atraumatic. Pupils are equal and reactive. Extraocular muscles intact. Moist mucous membranes.   NECK: Supple. No JVD.   CARDIOVASCULAR: S1, S2 regular rate and rhythm. No murmurs, rubs, or  gallops.   LUNGS: Clear to auscultation without wheezing or rhonchi.   ABDOMEN: Soft, nontender, nondistended. Hyperactive bowel sounds in all quadrants.   EXTREMITIES: 3+ edema in the right lower extremity, 2+ edema in the left lower extremity.   NEUROLOGIC: Cranial nerves II through XII appear to be grossly intact. Strength is 5/5 in all extremities. Sensation intact to light touch.   PSYCH: Awake, alert, oriented x3, pleasant and cooperative.   LABORATORY, DIAGNOSTIC, AND RADIOLOGICAL DATA: Creatinine 1.33, BUN 14, sodium 141, potassium 3.8. LFTs within normal limits. WBC 7.4, hemoglobin 11.3, hematocrit 33.1, platelets 168. INR is 1.   EKG sinus rhythm with first degree AV block, some PVC with axis deviation, incomplete left bundle branch block. No acute ST elevations or depressions. Rate is 80.   ASSESSMENT AND PLAN:  1. We have an 81 year old Caucasian male with history of CABG and CVA, hypertension, hyperlipidemia with recent EGD and colonoscopy during which eight polyps were removed as well as diverticulosis was noted, the colonoscopy as well as EGD showing gastritis who presents with ongoing lower GI bleed since the procedures. At this point we will admit the patient to the hospital. The patient appears to have acute hemorrhagic anemia as well with hemoglobin two points below the last one from January 2012. This is likely in the setting of lower GI bleed. It is possible this is bleeding from resected polyps or diverticular. We will trend the hemoglobin q.8 hours. We would also monitor the vitals. He is currently hemodynamically stable without tachycardia. I have discussed the case with Dr. Marva Panda who will come see the patient. There is no acute indications for transfusion, however, I have obtained consent and risks and benefits of blood transfusion have been explained to the patient and he has agreed for transfusion if required.   2. Renal failure, mild, possible acute. It is possible  this is from above in addition to being on HCTZ and lisinopril. I will hold ACE and HCTZ at this point and start the patient on gentle IV fluids given history of lower extremity edema. Will monitor the BMP in the morning.  3. Chronic lower extremity edema. The patient, per family and patient, has been having lower extremity edema for years and had an echocardiogram with Dr. Lady Gary several months ago. He was not told he had CHF. He is on low dose diuretics which I will hold at this point given the renal failure. I will put him on TEDs and SCDs for now. It is unclear if he has CHF but currently appears to be well compensated and not in acute CHF. Would monitor his vitals and ins and outs.  4. In regards to his CVA, we would hold the aspirin given the above and resume his statin. Start him on TEDs and SCDs for DVT prophylaxis.   CODE STATUS: FULL CODE.   TOTAL TIME SPENT: 45 minutes.   ____________________________ Krystal Eaton, MD sa:drc D: 03/01/2012 16:43:05 ET T: 03/02/2012 06:22:57 ET JOB#: [id number]  cc: Krystal Eaton, MD, <Dictator> Katina Dung. Dayna Barker, MD Scot Jun, MD Marcelle Smiling [facility] MD ELECTRONICALLY SIGNED 03/10/2012 15:19
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
## 2014-11-15 NOTE — Discharge Summary (Signed)
PATIENT NAME:  Francisco [name], [name] MR#:  [id number] DATE OF BIRTH:  1928-12-17  DATE OF ADMISSION:  03/01/2012 DATE OF DISCHARGE:  03/03/2012  For a detailed note, please take a look at the history and physical done by Dr. Jacques NavyAhmadzia on admission.   DIAGNOSES AT DISCHARGE:  1. Gastrointestinal bleed secondary to internal hemorrhoids status post ligation. 2. History of previous cerebrovascular accident. 3. Hypertension.  4. Hyperlipidemia.  5. Gastroesophageal reflux disease.   DIET: The patient is being discharged on a low sodium, low fat diet.   ACTIVITY: As tolerated.   FOLLOW-UP:  1. Follow-up with Dr. Leim FabryBarbara Aldridge in the next 1 to 2 weeks. 2. Follow-up with Dr. Renda RollsWilton Smith in the next four weeks.    DISCHARGE MEDICATIONS:  1. Metoprolol tartrate 25 mg b.i.d.  2. Omeprazole 20 mg daily.  3. Hydrochlorothiazide 25 mg daily.  4. Lisinopril 20 mg daily.  5. Atorvastatin 30 mg at bedtime. 6. Metamucil one cap b.i.d.  7. Multivitamin daily.  8. Vitamin B12 1000 mcg daily.  9. Anusol 2.5% rectal cream with applicator as needed.   CONSULTANTS DURING THE HOSPITAL COURSE:  1. Dr. Renda RollsWilton Smith from General Surgery  2. Dr. Barnetta ChapelMartin Skulskie from Gastroenterology    PERTINENT STUDIES DONE DURING THE HOSPITAL COURSE: Internal hemorrhoid ligation was done by Dr. Renda RollsWilton Smith on the evening of 03/02/2012.   BRIEF HOSPITAL COURSE: This is an 81 year old male with medical problems as mentioned above who presented to the hospital on 03/01/2012 secondary to intermittent lower GI bleeding.  1. Lower GI bleed. This was likely rectal bleeding. The patient has been having on and off intermittent rectal bleeding for quite a while. He was evaluated by Gastroenterology not too long ago and underwent polypectomy. The patient was seen by Gastroenterology by Dr. Marva PandaSkulskie who did not think that this was related to polypectomy but likely hemorrhoidal in nature, therefore, surgical consult was obtained. The  patient was seen by Dr. Renda RollsWilton Smith and he performed an internal hemorrhoid ligation on the evening of August 5th. Post surgery the patient still had some minimal bleeding but it was significantly improved. His hemoglobin has not dropped significantly. He is tolerating p.o. well with no further abdominal pain, nausea, or vomiting and is, therefore, being discharged home.  2. Hypertension. The patient remained hemodynamically stable on his metoprolol which he will resume.  3. Gastroesophageal reflux disease. The patient was maintained on his omeprazole. He will resume that.  4. Hyperlipidemia. The patient was maintained on his atorvastatin. He will resume that upon discharge too.   CODE STATUS: The patient is a FULL CODE.   TIME SPENT WITH THE DISCHARGE: 40 minutes.   ____________________________ Rolly PancakeVivek J. Cherlynn KaiserSainani, MD vjs:drc D: 03/03/2012 15:01:25 ET T: 03/04/2012 11:50:51 ET JOB#: [id number]  cc: Rolly PancakeVivek J. Cherlynn KaiserSainani, MD, <Dictator> Katina DungBarbara D. Dayna BarkerAldridge, MD J. Renda RollsWilton Smith, MD Houston SirenVIVEK J SAINANI MD ELECTRONICALLY SIGNED 03/06/2012 16:14

## 2014-11-15 NOTE — Consult Note (Signed)
Chief Complaint:   Subjective/Chief Complaint minimal rectal bleeding today, but hanst been standing up, when this usually happens.   VITAL SIGNS/ANCILLARY NOTES: **Vital Signs.:   05-Aug-13 14:41   Vital Signs Type Routine   Temperature Temperature (F) 98.1   Celsius 36.7   Temperature Source Oral   Pulse Pulse 60   Respirations Respirations 18   Systolic BP Systolic BP 952   Diastolic BP (mmHg) Diastolic BP (mmHg) 65   Mean BP 81   Pulse Ox % Pulse Ox % 96   Pulse Ox Activity Level  At rest   Oxygen Delivery Room Air/ 21 %   Brief Assessment:   Cardiac Regular    Respiratory clear BS    Gastrointestinal details normal Soft  Nontender  Nondistended  No masses palpable  Bowel sounds normal   Lab Results: Routine Chem:  05-Aug-13 02:17    Glucose, Serum 94   BUN 12   Creatinine (comp) 1.14   Sodium, Serum 142   Potassium, Serum 3.7   Chloride, Serum 106   CO2, Serum 23   Calcium (Total), Serum 9.2   Anion Gap 13   Osmolality (calc) 283   eGFR (African American) >60   eGFR (Non-African American) >60 (eGFR values <67m/min/1.73 m2 may be an indication of chronic kidney disease (CKD). Calculated eGFR is useful in patients with stable renal function. The eGFR calculation will not be reliable in acutely ill patients when serum creatinine is changing rapidly. It is not useful in  patients on dialysis. The eGFR calculation may not be applicable to patients at the low and high extremes of body sizes, pregnant women, and vegetarians.)  Routine Hem:  04-Aug-13 14:06    Hemoglobin (CBC)  11.3    21:54    Hemoglobin (CBC)  10.1 (Result(s) reported on 01 Mar 2012 at 10:21PM.)  05-Aug-13 02:17    WBC (CBC) 6.4   RBC (CBC)  3.29   Hemoglobin (CBC)  9.9   Hematocrit (CBC)  28.7   Platelet Count (CBC)  140   MCV 87   MCH 30.2   MCHC 34.6   RDW 13.5   Neutrophil % 60.2   Lymphocyte % 25.1   Monocyte % 12.0   Eosinophil % 2.1   Basophil % 0.6   Neutrophil # 3.8    Lymphocyte # 1.6   Monocyte # 0.8   Eosinophil # 0.1   Basophil # 0.0 (Result(s) reported on 02 Mar 2012 at 03:08AM.)   Assessment/Plan:  Assessment/Plan:   Assessment 1) rectal bleeding-likely anal outlet-recurrent, severe enough to cause hgb decrease    Plan 1) started on anusol cream.  awaiting surgery evaluation.   Electronic Signatures: SLoistine Simas(MD)  (Signed 05-Aug-13 16:37)  Authored: Chief Complaint, VITAL SIGNS/ANCILLARY NOTES, Brief Assessment, Lab Results, Assessment/Plan   Last Updated: 05-Aug-13 16:37 by SLoistine Simas(MD)

## 2014-11-15 NOTE — Consult Note (Signed)
Chief Complaint:   Subjective/Chief Complaint Patient seena dnexamined, full consult dictated.  Patient presenting with rectal bleeding.  On exam there is no blood in the rectal vault, however a large and recurrent amount of blood from the perirectal area, likely hemorrhoidal.  Significant drop of hgb from baseline of over 13.  Agree with admission for observation. Will ask Dr Katrinka BlazingSmith, with whom he has an outpatient appointment to see him.  Will institute treatment with anusol hc 2.5 % cream.   Electronic Signatures: Barnetta ChapelSkulskie, Vondell Babers (MD)  (Signed 04-Aug-13 18:33)  Authored: Chief Complaint   Last Updated: 04-Aug-13 18:33 by Barnetta ChapelSkulskie, [place] (MD)

## 2014-11-15 NOTE — Op Note (Signed)
PATIENT NAME:  Francisco [name], Francisco [name] MR#:  [id number] DATE OF BIRTH:  1929-02-20  DATE OF PROCEDURE:  03/02/2012  PREOPERATIVE DIAGNOSIS: Internal hemorrhoids with bleeding.   POSTOPERATIVE DIAGNOSIS: Internal hemorrhoids with bleeding.   PROCEDURE: Internal hemorrhoid rubber band ligation.   SURGEON: Renda RollsWilton Smith, MD  ANESTHESIA: Xylocaine jelly.   INDICATIONS: This 81 year old male was admitted with bright red rectal bleeding and had anoscopic findings of large internal hemorrhoids.  DESCRIPTION OF PROCEDURE: The patient was placed on the bed, in the left lateral decubitus position. The side-viewing anoscope was introduced and identified multiple large internal hemorrhoids. The three largest hemorrhoids were treated with rubber bands. There were other hemorrhoids seen, but it appeared that treating the three largest ones would most likely resolve his symptoms. He tolerated this well and the anoscope was removed.   Further instructions given. Anticipate he can be discharged tomorrow with follow-up in the office. ____________________________ J. Renda RollsWilton Smith, MD jws:slb D: 03/02/2012 18:38:46 ET T: 03/03/2012 10:15:53 ET JOB#: [id number]  cc: Adella HareJ. Wilton Smith, MD, <Dictator> Adella HareWILTON J SMITH MD ELECTRONICALLY SIGNED 03/04/2012 8:57

## 2014-11-15 NOTE — Consult Note (Signed)
PATIENT NAME:  Francisco Francisco [name], Francisco Francisco [name] MR#:  [id number] DATE OF BIRTH:  [birth year]  DATE OF CONSULTATION:  03/02/2012  CONSULTING PHYSICIAN:  Adella HareJ. Wilton Smith, MD  CHIEF COMPLAINT: Rectal bleeding.   HISTORY OF PRESENT ILLNESS: This 81 year old male came into the hospital with Francisco [name] chief complaint of rectal bleeding. He has had some bleeding off and on over the last two months. He has been moving his bowels satisfactorily but has been having bright red rectal bleeding. He did have colonoscopy last week. Francisco [name] number of polyps were removed. He had findings of diverticulosis, but then he was admitted yesterday due to repetitive bright red rectal bleeding.   PAST MEDICAL/SURGICAL HISTORY:  Past medical history was reviewed and does include: 1. Hypertension.  2. Hyperlipidemia.  3. 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  4. He has had coronary artery bypass grafting.  5. Right knee replacement.   MEDICATIONS: Aspirin, Lipitor, hydrochlorothiazide, lisinopril, Metamucil, metoprolol, omeprazole.   REVIEW OF SYSTEMS: He reports no recent chills or fever. No difficulties breathing. No chest pains. He does use Francisco [name] walker for balance. He does have some ankle edema.   PHYSICAL EXAMINATION:  VITAL SIGNS: Temperature is 98.1, pulse 60, respirations 18, blood pressure 115/65, oxygen saturation 96%. He is awake, alert, oriented, ambulatory.   LUNGS: Lung sounds are clear.   HEART: Regular rhythm, S1 and S2.   ABDOMEN: Soft, nontender, external anal appearance. Does reveal minimal evidence of external hemorrhoids.   RECTAL: Digital anorectal exam demonstrates no palpable mass. Anoscopy demonstrates marked enlargement of internal hemorrhoids which appear to be large enough to prolapse. No actual blood was seen. No neoplasm was seen, and the scope was removed.   NEUROLOGIC: Awake, alert, oriented but does have Francisco [name] wide gait, moving all extremities.   LABORATORY, DIAGNOSTIC AND  RADIOLOGICAL DATA: His creatinine is 1.14. His hemoglobin on admission was 11.3 and today is 9.9, platelet count 140,000. Pro time 13.6 seconds.   IMPRESSION:  1. Large internal hemorrhoids with bright red rectal bleeding. 2. Minimal enlargement of external hemorrhoids.  3. Anemia which appears to be at least partially associated with bleeding.   RECOMMENDATIONS: I recommended internal hemorrhoid rubber band ligation. I discussed the procedure and risks and benefits with him and made arrangements to do that immediately after the consultation.   ____________________________ Shela CommonsJ. Renda RollsWilton Smith, MD jws:cbb D: 03/02/2012 18:37:04 ET T: 03/02/2012 19:15:23 ET JOB#: [id number]  cc: Adella HareJ. Wilton Smith, MD, <Dictator>  Adella HareWILTON J SMITH MD ELECTRONICALLY SIGNED 03/04/2012 8:57

## 2015-02-22 ENCOUNTER — Other Ambulatory Visit: Payer: Self-pay | Admitting: Internal Medicine

## 2015-02-22 NOTE — Telephone Encounter (Signed)
No longer under Dr. Letvak's care 

## 2015-10-28 DEATH — deceased
